# Patient Record
Sex: Male | Born: 1985 | Race: White | Hispanic: No | Marital: Married | Smoking: Current every day smoker
Health system: Southern US, Community
[De-identification: ages and names within clinical notes are randomized; demographics above are authoritative.]

## PROBLEM LIST (undated history)

## (undated) DIAGNOSIS — F419 Anxiety disorder, unspecified: Secondary | ICD-10-CM

## (undated) DIAGNOSIS — M419 Scoliosis, unspecified: Secondary | ICD-10-CM

## (undated) HISTORY — DX: Anxiety disorder, unspecified: F41.9

## (undated) HISTORY — DX: Scoliosis, unspecified: M41.9

---
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
## 2005-04-25 ENCOUNTER — Emergency Department (HOSPITAL_COMMUNITY): Admission: EM | Admit: 2005-04-25 | Discharge: 2005-04-25 | Payer: Self-pay | Admitting: Emergency Medicine

## 2005-09-04 ENCOUNTER — Emergency Department (HOSPITAL_COMMUNITY): Admission: EM | Admit: 2005-09-04 | Discharge: 2005-09-05 | Payer: Self-pay | Admitting: Emergency Medicine

## 2005-10-04 ENCOUNTER — Emergency Department (HOSPITAL_COMMUNITY): Admission: EM | Admit: 2005-10-04 | Discharge: 2005-10-04 | Payer: Self-pay | Admitting: Emergency Medicine

## 2007-09-21 ENCOUNTER — Emergency Department (HOSPITAL_COMMUNITY): Admission: EM | Admit: 2007-09-21 | Discharge: 2007-09-21 | Payer: Self-pay | Admitting: Emergency Medicine

## 2007-11-23 ENCOUNTER — Emergency Department (HOSPITAL_COMMUNITY): Admission: EM | Admit: 2007-11-23 | Discharge: 2007-11-23 | Payer: Self-pay | Admitting: Family Medicine

## 2008-08-28 ENCOUNTER — Emergency Department (HOSPITAL_COMMUNITY): Admission: EM | Admit: 2008-08-28 | Discharge: 2008-08-28 | Payer: Self-pay | Admitting: Emergency Medicine

## 2008-10-23 ENCOUNTER — Emergency Department (HOSPITAL_COMMUNITY): Admission: EM | Admit: 2008-10-23 | Discharge: 2008-10-23 | Payer: Self-pay | Admitting: Emergency Medicine

## 2009-07-22 ENCOUNTER — Emergency Department (HOSPITAL_COMMUNITY): Admission: EM | Admit: 2009-07-22 | Discharge: 2009-07-22 | Payer: Self-pay | Admitting: Emergency Medicine

## 2009-10-21 ENCOUNTER — Encounter: Admission: RE | Admit: 2009-10-21 | Discharge: 2009-10-21 | Payer: Self-pay | Admitting: Internal Medicine

## 2010-07-26 ENCOUNTER — Emergency Department (HOSPITAL_COMMUNITY)
Admission: EM | Admit: 2010-07-26 | Discharge: 2010-07-26 | Payer: Self-pay | Source: Home / Self Care | Admitting: Emergency Medicine

## 2010-10-07 LAB — URINE CULTURE
Colony Count: NO GROWTH
Culture: NO GROWTH

## 2010-10-07 LAB — GC/CHLAMYDIA PROBE AMP, GENITAL
Chlamydia, DNA Probe: NEGATIVE
GC Probe Amp, Genital: NEGATIVE

## 2010-10-07 LAB — URINALYSIS, ROUTINE W REFLEX MICROSCOPIC
Bilirubin Urine: NEGATIVE
Glucose, UA: NEGATIVE mg/dL
Hgb urine dipstick: NEGATIVE
Ketones, ur: NEGATIVE mg/dL
Nitrite: NEGATIVE
Protein, ur: NEGATIVE mg/dL
Specific Gravity, Urine: 1.016 (ref 1.005–1.030)
Urobilinogen, UA: 0.2 mg/dL (ref 0.0–1.0)
pH: 6.5 (ref 5.0–8.0)

## 2015-05-16 ENCOUNTER — Encounter: Payer: Self-pay | Admitting: Family Medicine

## 2015-05-16 ENCOUNTER — Ambulatory Visit: Payer: Self-pay | Admitting: Pediatrics

## 2015-05-16 ENCOUNTER — Ambulatory Visit (INDEPENDENT_AMBULATORY_CARE_PROVIDER_SITE_OTHER): Payer: Medicaid Other | Admitting: Family Medicine

## 2015-05-16 VITALS — BP 130/78 | HR 76 | Temp 97.7°F | Ht 72.0 in | Wt 181.8 lb

## 2015-05-16 DIAGNOSIS — G894 Chronic pain syndrome: Secondary | ICD-10-CM
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
## 2016-10-07 ENCOUNTER — Emergency Department (HOSPITAL_COMMUNITY): Payer: No Typology Code available for payment source

## 2016-10-07 ENCOUNTER — Encounter (HOSPITAL_COMMUNITY): Payer: Self-pay

## 2016-10-07 ENCOUNTER — Emergency Department (HOSPITAL_COMMUNITY)
Admission: EM | Admit: 2016-10-07 | Discharge: 2016-10-08 | Disposition: A | Discharge status: Home / Self Care | Payer: No Typology Code available for payment source | Attending: Emergency Medicine | Admitting: Emergency Medicine

## 2016-10-07 DIAGNOSIS — S060X1A Concussion with loss of consciousness of 30 minutes or less, initial encounter: Secondary | ICD-10-CM | POA: Diagnosis not present

## 2016-10-07 DIAGNOSIS — R918 Other nonspecific abnormal finding of lung field: Secondary | ICD-10-CM | POA: Diagnosis not present

## 2016-10-07 DIAGNOSIS — M545 Low back pain, unspecified: Secondary | ICD-10-CM

## 2016-10-07 DIAGNOSIS — Y939 Activity, unspecified: Secondary | ICD-10-CM | POA: Insufficient documentation

## 2016-10-07 DIAGNOSIS — Y999 Unspecified external cause status: Secondary | ICD-10-CM | POA: Diagnosis not present

## 2016-10-07 DIAGNOSIS — F17200 Nicotine dependence, unspecified, uncomplicated: Secondary | ICD-10-CM | POA: Diagnosis not present

## 2016-10-07 DIAGNOSIS — R938 Abnormal findings on diagnostic imaging of other specified body structures: Secondary | ICD-10-CM | POA: Diagnosis not present

## 2016-10-07 DIAGNOSIS — S0990XA Unspecified injury of head, initial encounter: Secondary | ICD-10-CM

## 2016-10-07 DIAGNOSIS — Y92410 Unspecified street and highway as the place of occurrence of the external cause: Secondary | ICD-10-CM | POA: Insufficient documentation

## 2016-10-07 LAB — I-STAT CHEM 8, ED
BUN: 15 mg/dL (ref 6–20)
CREATININE: 0.9 mg/dL (ref 0.61–1.24)
Calcium, Ion: 1.14 mmol/L — ABNORMAL LOW (ref 1.15–1.40)
Chloride: 106 mmol/L (ref 101–111)
Glucose, Bld: 142 mg/dL — ABNORMAL HIGH (ref 65–99)
HEMATOCRIT: 43 % (ref 39.0–52.0)
Hemoglobin: 14.6 g/dL (ref 13.0–17.0)
Potassium: 3.4 mmol/L — ABNORMAL LOW (ref 3.5–5.1)
SODIUM: 141 mmol/L (ref 135–145)
TCO2: 26 mmol/L (ref 0–100)

## 2016-10-07 MED ORDER — FENTANYL CITRATE (PF) 100 MCG/2ML IJ SOLN
50.0000 ug | Freq: Once | INTRAMUSCULAR | Status: AC
  Administered 2016-10-07: 50 ug via INTRAVENOUS
  Filled 2016-10-07: qty 2

## 2016-10-07 MED ORDER — SODIUM CHLORIDE 0.9 % IV SOLN: INTRAVENOUS | Status: DC

## 2016-10-07 MED ORDER — ONDANSETRON HCL 4 MG/2ML IJ SOLN
4.0000 mg | Freq: Once | INTRAMUSCULAR | Status: AC
  Administered 2016-10-07: 4 mg via INTRAVENOUS
  Filled 2016-10-07: qty 2

## 2016-10-07 MED ORDER — IOPAMIDOL (ISOVUE-300) INJECTION 61%
100.0000 mL | Freq: Once | INTRAVENOUS | Status: AC | PRN
  Administered 2016-10-07: 100 mL via INTRAVENOUS

## 2016-10-07 MED ORDER — SODIUM CHLORIDE 0.9 % IV BOLUS (SEPSIS)
500.0000 mL | Freq: Once | INTRAVENOUS | Status: AC
  Administered 2016-10-08: 1000 mL via INTRAVENOUS

## 2016-10-07 NOTE — ED Notes (Signed)
Received report on pt, pt cooperative, c/o headache, right flank pain, denies any LOC, update given,

## 2016-10-07 NOTE — ED Notes (Signed)
MD Zackowski notified pt is requesting MD at this time.

## 2016-10-07 NOTE — ED Notes (Signed)
Pt removed c-collar applied by EMS while out in waiting room, c/o it hurting his adams apple. New c-collar applied, pt instructed to leave collar on and not adjust collar.
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
## 2016-10-07 NOTE — ED Notes (Signed)
Patient transported to X-ray 
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
## 2016-10-07 NOTE — ED Notes (Signed)
Pt and family repeatedly asking for pain medication, stating "some nurse told me she was going to give me pain medicine and they were going to take x-rays". Pt made aware there are no orders at this time and MD has signed up for pt. Aware of delay.

## 2016-10-07 NOTE — ED Notes (Signed)
Pt reports generalized back pain, hx of scoliosis, bilateral shoulder pain, and HA. Denies LOC.

## 2016-10-08 MED ORDER — NAPROXEN 500 MG PO TABS: 500.0000 mg | ORAL_TABLET | Freq: Two times a day (BID) | ORAL | 0 refills | Status: DC

## 2016-10-08 MED ORDER — HYDROMORPHONE HCL 1 MG/ML IJ SOLN
1.0000 mg | Freq: Once | INTRAMUSCULAR | Status: AC
  Administered 2016-10-08: 1 mg via INTRAVENOUS
  Filled 2016-10-08: qty 1

## 2016-10-08 MED ORDER — HYDROMORPHONE HCL 4 MG PO TABS: 4.0000 mg | ORAL_TABLET | Freq: Four times a day (QID) | ORAL | 0 refills | Status: DC | PRN

## 2016-10-08 MED ORDER — FENTANYL CITRATE (PF) 100 MCG/2ML IJ SOLN
50.0000 ug | Freq: Once | INTRAMUSCULAR | Status: AC
  Administered 2016-10-08: 50 ug via INTRAVENOUS
  Filled 2016-10-08: qty 2
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
## 2016-10-08 NOTE — ED Notes (Signed)
Pt and family updated on plan of care,  

## 2016-10-08 NOTE — ED Notes (Signed)
Pt returned from xray

## 2016-10-08 NOTE — Discharge Instructions (Signed)
Take the Naprosyn on a regular basis. Supplement with the hydromorphone as needed for additional pain relief. Expect to be very stiff and sore for the next several days. Workup here without any acute findings. Work note provided.
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
## 2020-02-26 ENCOUNTER — Other Ambulatory Visit: Payer: Self-pay

## 2020-02-26 ENCOUNTER — Encounter (HOSPITAL_COMMUNITY)
Admission: EM | Discharge status: Against Medical Advice | Payer: Self-pay | Source: Home / Self Care | Attending: Internal Medicine

## 2020-02-26 ENCOUNTER — Inpatient Hospital Stay (HOSPITAL_COMMUNITY): Payer: Self-pay | Admitting: Certified Registered Nurse Anesthetist

## 2020-02-26 ENCOUNTER — Emergency Department (HOSPITAL_COMMUNITY): Payer: Self-pay

## 2020-02-26 ENCOUNTER — Encounter (HOSPITAL_COMMUNITY): Payer: Self-pay | Admitting: Emergency Medicine

## 2020-02-26 ENCOUNTER — Inpatient Hospital Stay (HOSPITAL_COMMUNITY)
Admission: EM | Admit: 2020-02-26 | Discharge: 2020-02-27 | DRG: 603 | Discharge status: Against Medical Advice | Payer: Self-pay | Attending: Internal Medicine | Admitting: Internal Medicine

## 2020-02-26 DIAGNOSIS — E876 Hypokalemia: Secondary | ICD-10-CM | POA: Diagnosis present

## 2020-02-26 DIAGNOSIS — Z801 Family history of malignant neoplasm of trachea, bronchus and lung: Secondary | ICD-10-CM

## 2020-02-26 DIAGNOSIS — B957 Other staphylococcus as the cause of diseases classified elsewhere: Secondary | ICD-10-CM | POA: Diagnosis present

## 2020-02-26 DIAGNOSIS — F419 Anxiety disorder, unspecified: Secondary | ICD-10-CM | POA: Diagnosis present

## 2020-02-26 DIAGNOSIS — F17210 Nicotine dependence, cigarettes, uncomplicated: Secondary | ICD-10-CM | POA: Diagnosis present

## 2020-02-26 DIAGNOSIS — Z880 Allergy status to penicillin: Secondary | ICD-10-CM

## 2020-02-26 DIAGNOSIS — Z720 Tobacco use: Secondary | ICD-10-CM

## 2020-02-26 DIAGNOSIS — Z20822 Contact with and (suspected) exposure to covid-19: Secondary | ICD-10-CM | POA: Diagnosis present

## 2020-02-26 DIAGNOSIS — Z79899 Other long term (current) drug therapy: Secondary | ICD-10-CM

## 2020-02-26 DIAGNOSIS — F1110 Opioid abuse, uncomplicated: Secondary | ICD-10-CM

## 2020-02-26 DIAGNOSIS — L0211 Cutaneous abscess of neck: Principal | ICD-10-CM | POA: Diagnosis present

## 2020-02-26 DIAGNOSIS — Z8249 Family history of ischemic heart disease and other diseases of the circulatory system: Secondary | ICD-10-CM

## 2020-02-26 DIAGNOSIS — F1990 Other psychoactive substance use, unspecified, uncomplicated: Secondary | ICD-10-CM

## 2020-02-26 DIAGNOSIS — Z885 Allergy status to narcotic agent status: Secondary | ICD-10-CM

## 2020-02-26 HISTORY — PX: INCISION AND DRAINAGE ABSCESS: SHX5864

## 2020-02-26 LAB — CBC WITH DIFFERENTIAL/PLATELET
Abs Immature Granulocytes: 0.09 10*3/uL — ABNORMAL HIGH (ref 0.00–0.07)
Basophils Absolute: 0.1 10*3/uL (ref 0.0–0.1)
Basophils Relative: 0 %
Eosinophils Absolute: 0.1 10*3/uL (ref 0.0–0.5)
Eosinophils Relative: 1 %
HCT: 40 % (ref 39.0–52.0)
Hemoglobin: 13.3 g/dL (ref 13.0–17.0)
Immature Granulocytes: 1 %
Lymphocytes Relative: 15 %
Lymphs Abs: 2.7 10*3/uL (ref 0.7–4.0)
MCH: 30.5 pg (ref 26.0–34.0)
MCHC: 33.3 g/dL (ref 30.0–36.0)
MCV: 91.7 fL (ref 80.0–100.0)
Monocytes Absolute: 1 10*3/uL (ref 0.1–1.0)
Monocytes Relative: 6 %
Neutro Abs: 14.2 10*3/uL — ABNORMAL HIGH (ref 1.7–7.7)
Neutrophils Relative %: 77 %
Platelets: 424 10*3/uL — ABNORMAL HIGH (ref 150–400)
RBC: 4.36 MIL/uL (ref 4.22–5.81)
RDW: 13 % (ref 11.5–15.5)
WBC: 18.2 10*3/uL — ABNORMAL HIGH (ref 4.0–10.5)
nRBC: 0 % (ref 0.0–0.2)

## 2020-02-26 LAB — BASIC METABOLIC PANEL
Anion gap: 9 (ref 5–15)
BUN: 12 mg/dL (ref 6–20)
CO2: 27 mmol/L (ref 22–32)
Calcium: 9 mg/dL (ref 8.9–10.3)
Chloride: 101 mmol/L (ref 98–111)
Creatinine, Ser: 0.79 mg/dL (ref 0.61–1.24)
GFR calc Af Amer: >60 mL/min (ref >60)
GFR calc non Af Amer: >60 mL/min (ref >60)
Glucose, Bld: 103 mg/dL — ABNORMAL HIGH (ref 70–99)
Potassium: 3.3 mmol/L — ABNORMAL LOW (ref 3.5–5.1)
Sodium: 137 mmol/L (ref 135–145)

## 2020-02-26 LAB — SARS CORONAVIRUS 2 BY RT PCR (HOSPITAL ORDER, PERFORMED IN ~~LOC~~ HOSPITAL LAB): SARS Coronavirus 2: NEGATIVE

## 2020-02-26 LAB — SURGICAL PCR SCREEN
MRSA, PCR: NEGATIVE
Staphylococcus aureus: NEGATIVE

## 2020-02-26 SURGERY — INCISION AND DRAINAGE, ABSCESS
Anesthesia: General | Site: Neck | Laterality: Left

## 2020-02-26 MED ORDER — FENTANYL CITRATE (PF) 250 MCG/5ML IJ SOLN
INTRAMUSCULAR | Status: AC
Start: 2020-02-26 — End: ?
  Filled 2020-02-26: qty 5

## 2020-02-26 MED ORDER — PROPOFOL 10 MG/ML IV BOLUS
INTRAVENOUS | Status: AC
  Filled 2020-02-26: qty 20

## 2020-02-26 MED ORDER — FENTANYL CITRATE (PF) 100 MCG/2ML IJ SOLN
INTRAMUSCULAR | Status: AC
  Filled 2020-02-26: qty 2

## 2020-02-26 MED ORDER — LIDOCAINE-EPINEPHRINE 1 %-1:100000 IJ SOLN
INTRAMUSCULAR | Status: AC
  Filled 2020-02-26: qty 1

## 2020-02-26 MED ORDER — METRONIDAZOLE IN NACL 5-0.79 MG/ML-% IV SOLN
500.0000 mg | Freq: Once | INTRAVENOUS | Status: AC
  Administered 2020-02-26: 500 mg via INTRAVENOUS
  Filled 2020-02-26: qty 100

## 2020-02-26 MED ORDER — ONDANSETRON HCL 4 MG/2ML IJ SOLN
4.0000 mg | Freq: Once | INTRAMUSCULAR | Status: DC
  Filled 2020-02-26: qty 2

## 2020-02-26 MED ORDER — OXYCODONE HCL 5 MG PO TABS
ORAL_TABLET | ORAL | Status: AC
  Filled 2020-02-26: qty 1

## 2020-02-26 MED ORDER — PROPOFOL 10 MG/ML IV BOLUS
INTRAVENOUS | Status: DC | PRN
  Administered 2020-02-26: 200 mg via INTRAVENOUS

## 2020-02-26 MED ORDER — ENOXAPARIN SODIUM 40 MG/0.4ML ~~LOC~~ SOLN
40.0000 mg | SUBCUTANEOUS | Status: DC
  Filled 2020-02-26: qty 0.4

## 2020-02-26 MED ORDER — ONDANSETRON HCL 4 MG/2ML IJ SOLN: 4.0000 mg | Freq: Four times a day (QID) | INTRAMUSCULAR | Status: DC | PRN

## 2020-02-26 MED ORDER — HYDROMORPHONE HCL 1 MG/ML IJ SOLN
1.0000 mg | INTRAMUSCULAR | Status: DC | PRN
  Administered 2020-02-26 – 2020-02-27 (×5): 1 mg via INTRAVENOUS
  Filled 2020-02-26 (×5): qty 1

## 2020-02-26 MED ORDER — VANCOMYCIN HCL 1500 MG/300ML IV SOLN
1500.0000 mg | Freq: Once | INTRAVENOUS | Status: DC
  Filled 2020-02-26 (×2): qty 300

## 2020-02-26 MED ORDER — SUCCINYLCHOLINE CHLORIDE 200 MG/10ML IV SOSY
PREFILLED_SYRINGE | INTRAVENOUS | Status: DC | PRN
  Administered 2020-02-26: 100 mg via INTRAVENOUS

## 2020-02-26 MED ORDER — FENTANYL CITRATE (PF) 250 MCG/5ML IJ SOLN
INTRAMUSCULAR | Status: AC
  Filled 2020-02-26: qty 5

## 2020-02-26 MED ORDER — MORPHINE SULFATE (PF) 4 MG/ML IV SOLN
4.0000 mg | Freq: Once | INTRAVENOUS | Status: DC
  Filled 2020-02-26: qty 1

## 2020-02-26 MED ORDER — CHLORHEXIDINE GLUCONATE 0.12 % MT SOLN
OROMUCOSAL | Status: AC
  Administered 2020-02-26: 15 mL via OROMUCOSAL
  Filled 2020-02-26: qty 15

## 2020-02-26 MED ORDER — ACETAMINOPHEN 325 MG PO TABS: 650.0000 mg | ORAL_TABLET | Freq: Four times a day (QID) | ORAL | Status: DC | PRN

## 2020-02-26 MED ORDER — VANCOMYCIN HCL 1500 MG/300ML IV SOLN
1500.0000 mg | Freq: Two times a day (BID) | INTRAVENOUS | Status: DC
  Administered 2020-02-26 – 2020-02-27 (×2): 1500 mg via INTRAVENOUS
  Filled 2020-02-26 (×3): qty 300

## 2020-02-26 MED ORDER — MIDAZOLAM HCL 5 MG/5ML IJ SOLN
INTRAMUSCULAR | Status: DC | PRN
  Administered 2020-02-26: 2 mg via INTRAVENOUS

## 2020-02-26 MED ORDER — ACETAMINOPHEN 650 MG RE SUPP: 650.0000 mg | Freq: Four times a day (QID) | RECTAL | Status: DC | PRN

## 2020-02-26 MED ORDER — ONDANSETRON HCL 4 MG/2ML IJ SOLN
INTRAMUSCULAR | Status: DC | PRN
  Administered 2020-02-26: 4 mg via INTRAVENOUS

## 2020-02-26 MED ORDER — FENTANYL CITRATE (PF) 100 MCG/2ML IJ SOLN
25.0000 ug | INTRAMUSCULAR | Status: DC | PRN
  Administered 2020-02-26 (×3): 50 ug via INTRAVENOUS

## 2020-02-26 MED ORDER — HYDROMORPHONE HCL 1 MG/ML IJ SOLN
1.0000 mg | Freq: Once | INTRAMUSCULAR | Status: AC
  Administered 2020-02-26: 1 mg via INTRAVENOUS
  Filled 2020-02-26: qty 1

## 2020-02-26 MED ORDER — IOHEXOL 300 MG/ML  SOLN
75.0000 mL | Freq: Once | INTRAMUSCULAR | Status: AC | PRN
  Administered 2020-02-26: 75 mL via INTRAVENOUS

## 2020-02-26 MED ORDER — ONDANSETRON HCL 4 MG PO TABS: 4.0000 mg | ORAL_TABLET | Freq: Four times a day (QID) | ORAL | Status: DC | PRN

## 2020-02-26 MED ORDER — DEXMEDETOMIDINE HCL 200 MCG/2ML IV SOLN
INTRAVENOUS | Status: DC | PRN
  Administered 2020-02-26: 20 ug via INTRAVENOUS

## 2020-02-26 MED ORDER — OXYCODONE HCL 5 MG PO TABS
5.0000 mg | ORAL_TABLET | Freq: Once | ORAL | Status: AC | PRN
  Administered 2020-02-26: 5 mg via ORAL

## 2020-02-26 MED ORDER — MIDAZOLAM HCL 2 MG/2ML IJ SOLN
INTRAMUSCULAR | Status: AC
  Filled 2020-02-26: qty 2

## 2020-02-26 MED ORDER — SODIUM CHLORIDE 0.9 % IR SOLN
Status: DC | PRN
  Administered 2020-02-26: 1000 mL

## 2020-02-26 MED ORDER — LIDOCAINE 2% (20 MG/ML) 5 ML SYRINGE
INTRAMUSCULAR | Status: DC | PRN
  Administered 2020-02-26: 50 mg via INTRAVENOUS

## 2020-02-26 MED ORDER — SODIUM CHLORIDE 0.9 % IV SOLN
2.0000 g | Freq: Three times a day (TID) | INTRAVENOUS | Status: DC
  Administered 2020-02-26 – 2020-02-27 (×2): 2 g via INTRAVENOUS
  Filled 2020-02-26 (×2): qty 2

## 2020-02-26 MED ORDER — HYDROMORPHONE HCL 2 MG/ML IJ SOLN
2.0000 mg | Freq: Once | INTRAMUSCULAR | Status: AC
  Administered 2020-02-26: 2 mg via INTRAMUSCULAR
  Filled 2020-02-26: qty 1

## 2020-02-26 MED ORDER — POTASSIUM CHLORIDE IN NACL 20-0.9 MEQ/L-% IV SOLN
INTRAVENOUS | Status: DC
  Filled 2020-02-26 (×2): qty 1000

## 2020-02-26 MED ORDER — ONDANSETRON HCL 4 MG/2ML IJ SOLN
4.0000 mg | Freq: Four times a day (QID) | INTRAMUSCULAR | Status: DC | PRN
  Administered 2020-02-27: 4 mg via INTRAVENOUS
  Filled 2020-02-26: qty 2

## 2020-02-26 MED ORDER — LACTATED RINGERS IV SOLN: INTRAVENOUS | Status: DC

## 2020-02-26 MED ORDER — FENTANYL CITRATE (PF) 250 MCG/5ML IJ SOLN
INTRAMUSCULAR | Status: DC | PRN
Start: 2020-02-26 — End: 2020-02-26
  Administered 2020-02-26 (×2): 100 ug via INTRAVENOUS
  Administered 2020-02-26 (×2): 150 ug via INTRAVENOUS

## 2020-02-26 MED ORDER — LIDOCAINE-EPINEPHRINE 1 %-1:100000 IJ SOLN
INTRAMUSCULAR | Status: DC | PRN
  Administered 2020-02-26: 1 mL

## 2020-02-26 MED ORDER — VANCOMYCIN HCL IN DEXTROSE 1-5 GM/200ML-% IV SOLN
1000.0000 mg | Freq: Once | INTRAVENOUS | Status: AC
  Administered 2020-02-26: 1000 mg via INTRAVENOUS
  Filled 2020-02-26: qty 200

## 2020-02-26 MED ORDER — OXYCODONE HCL 5 MG/5ML PO SOLN: 5.0000 mg | Freq: Once | ORAL | Status: AC | PRN

## 2020-02-26 MED ORDER — NICOTINE 21 MG/24HR TD PT24
21.0000 mg | MEDICATED_PATCH | Freq: Every day | TRANSDERMAL | Status: DC
  Administered 2020-02-26 – 2020-02-27 (×2): 21 mg via TRANSDERMAL
  Filled 2020-02-26 (×2): qty 1

## 2020-02-26 MED ORDER — CHLORHEXIDINE GLUCONATE 0.12 % MT SOLN: 15.0000 mL | Freq: Once | OROMUCOSAL | Status: AC

## 2020-02-26 MED ORDER — SODIUM CHLORIDE 0.9 % IV SOLN
2.0000 g | Freq: Once | INTRAVENOUS | Status: AC
  Administered 2020-02-26: 2 g via INTRAVENOUS
  Filled 2020-02-26: qty 2

## 2020-02-26 MED ORDER — LACTATED RINGERS IV SOLN: INTRAVENOUS | Status: DC | PRN

## 2020-02-26 SURGICAL SUPPLY — 47 items
BAND INSRT 18 STRL LF DISP RB (MISCELLANEOUS)
BAND RUBBER #18 3X1/16 STRL (MISCELLANEOUS) IMPLANT
BLADE SURG 15 STRL LF DISP TIS (BLADE) IMPLANT
BLADE SURG 15 STRL SS (BLADE)
BNDG CONFORM 2 STRL LF (GAUZE/BANDAGES/DRESSINGS) ×3 IMPLANT
BNDG GAUZE ELAST 4 BULKY (GAUZE/BANDAGES/DRESSINGS) ×2 IMPLANT
CATH ROBINSON RED A/P 12FR (CATHETERS) ×5 IMPLANT
COVER SURGICAL LIGHT HANDLE (MISCELLANEOUS) ×6 IMPLANT
COVER WAND RF STERILE (DRAPES) ×3 IMPLANT
DECANTER SPIKE VIAL GLASS SM (MISCELLANEOUS) ×2 IMPLANT
DRAIN PENROSE 1/4X12 LTX STRL (WOUND CARE) ×1 IMPLANT
DRAPE HALF SHEET 40X57 (DRAPES) IMPLANT
DRAPE ORTHO SPLIT 77X108 STRL (DRAPES) ×3
DRAPE SURG ORHT 6 SPLT 77X108 (DRAPES) ×1 IMPLANT
DRSG PAD ABDOMINAL 8X10 ST (GAUZE/BANDAGES/DRESSINGS) IMPLANT
ELECT COATED BLADE 2.86 ST (ELECTRODE) ×5 IMPLANT
ELECT REM PT RETURN 9FT ADLT (ELECTROSURGICAL) ×3
ELECTRODE REM PT RTRN 9FT ADLT (ELECTROSURGICAL) ×1 IMPLANT
GAUZE 4X4 16PLY RFD (DISPOSABLE) ×3 IMPLANT
GAUZE SPONGE 4X4 12PLY STRL (GAUZE/BANDAGES/DRESSINGS) IMPLANT
GLOVE BIO SURGEON STRL SZ7.5 (GLOVE) ×3 IMPLANT
GLOVE BIOGEL PI IND STRL 6.5 (GLOVE) IMPLANT
GLOVE BIOGEL PI INDICATOR 6.5 (GLOVE) ×2
GLOVE INDICATOR 7.5 STRL GRN (GLOVE) ×4 IMPLANT
GOWN STRL REUS W/ TWL LRG LVL3 (GOWN DISPOSABLE) ×1 IMPLANT
GOWN STRL REUS W/ TWL XL LVL3 (GOWN DISPOSABLE) IMPLANT
GOWN STRL REUS W/TWL LRG LVL3 (GOWN DISPOSABLE) ×3
GOWN STRL REUS W/TWL XL LVL3 (GOWN DISPOSABLE) ×3
KIT BASIN OR (CUSTOM PROCEDURE TRAY) ×3 IMPLANT
KIT TURNOVER KIT B (KITS) ×3 IMPLANT
MARKER SKIN DUAL TIP RULER LAB (MISCELLANEOUS) ×3 IMPLANT
NDL HYPO 25GX1X1/2 BEV (NEEDLE) ×1 IMPLANT
NEEDLE HYPO 25GX1X1/2 BEV (NEEDLE) ×6 IMPLANT
NS IRRIG 1000ML POUR BTL (IV SOLUTION) ×3 IMPLANT
PACK SURGICAL SETUP 50X90 (CUSTOM PROCEDURE TRAY) ×3 IMPLANT
PAD ARMBOARD 7.5X6 YLW CONV (MISCELLANEOUS) ×6 IMPLANT
PENCIL SMOKE EVACUATOR (MISCELLANEOUS) ×3 IMPLANT
POSITIONER HEAD DONUT 9IN (MISCELLANEOUS) IMPLANT
SUT ETHILON 2 0 FS 18 (SUTURE) ×3 IMPLANT
SUT SILK 2 0 SH CR/8 (SUTURE) ×2 IMPLANT
SWAB COLLECTION DEVICE MRSA (MISCELLANEOUS) ×3 IMPLANT
SWAB CULTURE ESWAB REG 1ML (MISCELLANEOUS) ×3 IMPLANT
SYR BULB IRRIG 60ML STRL (SYRINGE) ×3 IMPLANT
SYR CONTROL 10ML LL (SYRINGE) ×2 IMPLANT
TUBE CONNECTING 12'X1/4 (SUCTIONS) ×1
TUBE CONNECTING 12X1/4 (SUCTIONS) ×2 IMPLANT
YANKAUER SUCT BULB TIP NO VENT (SUCTIONS) ×3 IMPLANT

## 2020-02-26 NOTE — Anesthesia Procedure Notes (Signed)
Procedure Name: Intubation Date/Time: 02/26/2020 6:24 PM Performed by: Shirlyn Goltz, CRNA Pre-anesthesia Checklist: Patient identified, Emergency Drugs available and Suction available Patient Re-evaluated:Patient Re-evaluated prior to induction Oxygen Delivery Method: Circle system utilized Preoxygenation: Pre-oxygenation with 100% oxygen Induction Type: IV induction and Rapid sequence Laryngoscope Size: Mac and 4 Grade View: Grade II Tube type: Oral Tube size: 7.5 mm Number of attempts: 1 Airway Equipment and Method: Stylet Placement Confirmation: ETT inserted through vocal cords under direct vision,  positive ETCO2 and breath sounds checked- equal and bilateral Secured at: 22 cm Tube secured with: Tape Dental Injury: Teeth and Oropharynx as per pre-operative assessment

## 2020-02-26 NOTE — Op Note (Addendum)
Preop diagnosis: Left neck abscess Postop diagnosis: same Procedure: Incision and drainage left neck abscess Surgeon: Jenne Pane Assist: None Anesth: General and local with 1% lidocaine with 1:100,000 epinephrine Compl: None Findings: Copious yellow pus drained, cultures obtained Description:  After discussing risks, benefits, and alternatives, the patient was brought to the operative suite and placed on the operative table in the supine position.  Anesthesia was induced and the patient was intubated by the anesthesia team without difficulty.  The left neck incision was marked with a marking pen and injected with local anesthetic.  The neck was prepped and draped in sterile fashion.  The incision was made with a 15 blade scalpel and extended through the subcutaneous and platysma layers with Bovie electrocautery.  Blunt dissection with a hemostat was then performed into the fluid pocket.  Copious yellow pus drained.  Culture swabs were used within the pocket.  Loculations were broken within the abscess space using a blunt clamp.  The pocket was then copiously irrigated with saline using a red rubber catheter.  A 1/4 inch Penrose drain was placed in the depth of the wound and secured at the skin using 2-0 Nylon.  Drapes were removed and the patient was cleaned off.  A Kerlex fluff dressing was placed around the neck.  He was then returned to anesthesia for wake-up and was extubated and moved to the recovery room in stable condition.  Of note, a crusted area on the right chin was denuded and yellow pus was expressed from this site until only blood was encountered.  A dry dressing was applied.
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
## 2020-02-26 NOTE — ED Notes (Signed)
Pt requesting to leave and sign out AMA. Pt informed if signs out AMA and leave to go another facility will have to start the whole process over, midline IV will have to be removed. Informed pt and mother best to wait and see if we can get him transferred to another facility and let the EDP speak with ENT and general surgery first. Pt and mother agree. EDP at bedside to discuss plan of care.

## 2020-02-26 NOTE — Anesthesia Preprocedure Evaluation (Signed)
Anesthesia Evaluation  Patient identified by MRN, date of birth, ID band Patient awake    Reviewed: Allergy & Precautions, H&P , NPO status , Patient's Chart, lab work & pertinent test results  Airway Mallampati: II   Neck ROM: full    Dental   Pulmonary Current Smoker,    breath sounds clear to auscultation       Cardiovascular negative cardio ROS   Rhythm:regular Rate:Normal     Neuro/Psych PSYCHIATRIC DISORDERS Anxiety    GI/Hepatic   Endo/Other    Renal/GU      Musculoskeletal   Abdominal   Peds  Hematology   Anesthesia Other Findings   Reproductive/Obstetrics                             Anesthesia Physical Anesthesia Plan  ASA: II  Anesthesia Plan: General   Post-op Pain Management:    Induction: Intravenous  PONV Risk Score and Plan: 1 and Ondansetron, Dexamethasone, Midazolam and Treatment may vary due to age or medical condition  Airway Management Planned: Oral ETT  Additional Equipment:   Intra-op Plan:   Post-operative Plan: Extubation in OR  Informed Consent: I have reviewed the patients History and Physical, chart, labs and discussed the procedure including the risks, benefits and alternatives for the proposed anesthesia with the patient or authorized representative who has indicated his/her understanding and acceptance.       Plan Discussed with: CRNA, Anesthesiologist and Surgeon  Anesthesia Plan Comments:         Anesthesia Quick Evaluation
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
## 2020-02-26 NOTE — ED Notes (Signed)
Attempted to call IV Team without success.  Called [facility].  They said. "May be between 2-4pm.  Nurse informed.

## 2020-02-26 NOTE — Progress Notes (Signed)
Pharmacy Antibiotic Note  Noah Turner is a 34 y.o. male admitted on 02/26/2020 with cellulitis/wound infection.  Pharmacy has been consulted for Cefepime and Vancomycin dosing.  Plan: Vancomycin 1500 IV every 12 hours.  Goal trough 10-15 mcg/mL. Cefepime 2gm IV q8h F/u Cxs and clinical progress Monitor V/S, labs and levels as indicated  Height: 6' (182.9 cm) Weight: 86.2 kg (190 lb) IBW/kg (Calculated) : 77.6  Temp (24hrs), Avg:99.7 F (37.6 C), Min:99.5 F (37.5 C), Max:99.8 F (37.7 C)  Recent Labs  Lab 02/26/20 0919  WBC 18.2*  CREATININE 0.79    Estimated Creatinine Clearance: 144.2 mL/min (by C-G formula based on SCr of 0.79 mg/dL).    Allergies  Allergen Reactions  . Hydrocodone   . Penicillins     Antimicrobials this admission: Vancomycin 8/31 >>  Cefepime 8/31 >>  Flagyl 8/31 x 1 dose  Dose adjustments this admission: prn  Microbiology results: No cxs  Thank you for allowing pharmacy to be a part of this patient's care.  Elder Cyphers, BS Loura Back, [place] Clinical Pharmacist Pager [phone] 02/26/2020 3:03 PM

## 2020-02-26 NOTE — Progress Notes (Signed)
NEW ADMISSION NOTE New Admission Note:   Arrival Method: via stretcher from [facility] ED Mental Orientation: Alert and oriented x4 Telemetry: n/a Assessment: Completed Skin: redness and swollen neck; blister on chin. IV: R upper arm midline Pain: 5/10 Tubes: n/a Safety Measures: Safety Fall Prevention Plan has been given, discussed and signed Admission: In progress [facility] Orientation: Patient has been orientated to the room, unit and staff.  Family: at bedside.  Dr. Jenne Pane notified of patient arrival. Orders have been reviewed and implemented. Will continue to monitor the patient. Call light has been placed within reach and bed alarm has been activated.   Margretta Sidle, RN3

## 2020-02-26 NOTE — ED Provider Notes (Signed)
[facility] EMERGENCY DEPARTMENT Provider Note   CSN: [id number] Arrival date & time: 02/26/20  0757     History Chief Complaint  Patient presents with  . neck mass  . Insect Bite    Noah Turner is a 34 y.o. male.  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  Noah Turner was recently admitted to [facility] for an abscess to the right anterior aspect of his neck.  This was apparently incised and drained by general surgery there.  Noah Turner is now having a recurrence of this, but this time to the left anterior aspect of his neck.  The swelling has worsened over the past several days.  Noah Turner describes difficulty swallowing, but denies difficulty breathing.  Noah Turner denies fevers or chills.  The history is provided by the patient.       Past Medical History:  Diagnosis Date  . Anxiety   . Scoliosis     There are no problems to display for this patient.   History reviewed. No pertinent surgical history.     Family History  Problem Relation Age of Onset  . CAD Father   . Lung cancer Paternal Grandfather     Social History   Tobacco Use  . Smoking status: Current Every Day Smoker    Packs/day: 0.50    Years: 15.00    Pack years: 7.50  . Smokeless tobacco: Never Used  Substance Use Topics  . Alcohol use: Yes    Comment: 1/2 beer today  . Drug use: No    Comment: history of heroine abuse    Home Medications Prior to Admission medications   Medication Sig Start Date End Date Taking? Authorizing Provider  AMITIZA 24 MCG capsule Take 1 capsule by mouth 2 (two) times daily. As needed for constipation 04/19/15   [provider]  gabapentin (NEURONTIN) 300 MG capsule Take 1 capsule at lunch, 2 capsule at supper and 3 caps nightly at bedtime max 6 caps per day 04/19/15   [provider]  HYDROmorphone (DILAUDID) 4 MG tablet Take 1 tablet (4 mg total) by mouth every 6 (six) hours as needed for severe pain. 10/08/16   Vanetta Mulders, MD    naproxen (NAPROSYN) 500 MG tablet Take 1 tablet (500 mg total) by mouth 2 (two) times daily. 10/08/16   Vanetta Mulders, MD  SUBOXONE 8-2 MG FILM Place 1 Film under the tongue every 8 (eight) hours. 04/19/15   [provider]    Allergies    Hydrocodone and Penicillins  Review of Systems   Review of Systems  All other systems reviewed and are negative.   Physical Exam Updated Vital Signs BP (!) 132/93   Pulse 93   Temp 99.5 F (37.5 C)   Resp 20   Ht 6' (1.829 m)   Wt 86.2 kg   SpO2 97%   BMI 25.77 kg/m   Physical Exam Vitals and nursing note reviewed.  Constitutional:      General: Noah Turner is not in acute distress.    Appearance: Noah Turner is well-developed. Noah Turner is not diaphoretic.  HENT:     Head: Normocephalic and atraumatic.  Neck:     Comments: There is a 4 cm x 6 cm area of induration, swelling, warmth, and erythema to the left anterior aspect of the neck.  There is also erythema and purulent drainage to the anterior aspect of the chin. Cardiovascular:     Rate and Rhythm: Normal rate and  regular rhythm.     Heart sounds: No murmur heard.  No friction rub.  Pulmonary:     Effort: Pulmonary effort is normal. No respiratory distress.     Breath sounds: Normal breath sounds. No wheezing or rales.  Abdominal:     General: Bowel sounds are normal. There is no distension.     Palpations: Abdomen is soft.     Tenderness: There is no abdominal tenderness.  Musculoskeletal:        General: Normal range of motion.     Cervical back: Normal range of motion and neck supple. Tenderness present.  Skin:    General: Skin is warm and dry.  Neurological:     Mental Status: Noah Turner is alert and oriented to person, place, and time.     Coordination: Coordination normal.     ED Results / Procedures / Treatments   Labs (all labs ordered are listed, but only abnormal results are displayed) Labs Reviewed  BASIC METABOLIC PANEL  CBC WITH DIFFERENTIAL/PLATELET     EKG None  Radiology No results found.  Procedures Procedures (including critical care time)  Medications Ordered in ED Medications  ondansetron (ZOFRAN) injection 4 mg (has no administration in time range)  morphine 4 MG/ML injection 4 mg (has no administration in time range)  vancomycin (VANCOCIN) IVPB 1000 mg/200 mL premix (has no administration in time range)    ED Course  I have reviewed the triage vital signs and the nursing notes.  Pertinent labs & imaging results that were available during my care of the patient were reviewed by me and considered in my medical decision making (see chart for details).    MDM Rules/Calculators/A&P  Patient is a 34 year old male with history of IV drug abuse and recent admission/surgical debridement at [facility] of a right neck abscess.  Patient presents here today with complaints of swelling and pain this time to the left side of his neck.  CT scan is consistent with an multilocular abscess of the soft tissues of the neck extending along the sternocleidomastoid muscle along with displacement of the subglottic trachea and posterior displacement of the left thyroid lobe and effacement of the left caudal internal jugular.  This finding was discussed with Dr. Jenne Pane from ENT.  Noah Turner has recommended admission to the hospitalist service and Noah Turner plans to incise and drain the area this evening.  Patient to be transferred to [facility] for this procedure.  Pain medication given and vancomycin also administered.  Also of note is that this patient is a very difficult stick.  Noah Turner has minimal veins secondary to his history of IV drug use and required a midline catheter.  CRITICAL CARE Performed by: Geoffery Lyons Total critical care time: 45 minutes Critical care time was exclusive of separately billable procedures and treating other patients. Critical care was necessary to treat or prevent imminent or life-threatening deterioration. Critical care was time  spent personally by me on the following activities: development of treatment plan with patient and/or surrogate as well as nursing, discussions with consultants, evaluation of patient's response to treatment, examination of patient, obtaining history from patient or surrogate, ordering and performing treatments and interventions, ordering and review of laboratory studies, ordering and review of radiographic studies, pulse oximetry and re-evaluation of patient's condition.   Final Clinical Impression(s) / ED Diagnoses Final diagnoses:  None    Rx / DC Orders ED Discharge Orders    None       Geoffery Lyons, MD 02/26/20 1444

## 2020-02-26 NOTE — Progress Notes (Signed)
Report attempted x 1
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
## 2020-02-26 NOTE — Brief Op Note (Signed)
02/26/2020  6:50 PM  PATIENT:  Noah Turner  34 y.o. male  PRE-OPERATIVE DIAGNOSIS:  Left neck abscess  POST-OPERATIVE DIAGNOSIS:  Left neck abscess  PROCEDURE:  Procedure(s): INCISION AND DRAINAGE LEFT NECK ABSCESS (Left)  SURGEON:  Surgeon(s) and Role:    Christia Reading, MD - Primary  PHYSICIAN ASSISTANT:   ASSISTANTS: none   ANESTHESIA:   general  EBL: Minimal  BLOOD ADMINISTERED:none  DRAINS: Penrose drain in the left neck   LOCAL MEDICATIONS USED:  LIDOCAINE   SPECIMEN:  No Specimen  DISPOSITION OF SPECIMEN:  N/A  COUNTS:  YES  TOURNIQUET:  * No tourniquets in log *  DICTATION: .Note written in EPIC  PLAN OF CARE: Return to hospital room  PATIENT DISPOSITION:  PACU - hemodynamically stable.   Delay start of Pharmacological VTE agent (>24hrs) due to surgical blood loss or risk of bleeding: no

## 2020-02-26 NOTE — Transfer of Care (Signed)
Immediate Anesthesia Transfer of Care Note  Patient: Noah Turner  Procedure(s) Performed: INCISION AND DRAINAGE LEFT NECK ABSCESS (Left Neck)  Patient Location: PACU  Anesthesia Type:General  Level of Consciousness: awake, alert , oriented and patient cooperative  Airway & Oxygen Therapy: Patient Spontanous Breathing and Patient connected to nasal cannula oxygen  Post-op Assessment: Report given to RN and Post -op Vital signs reviewed and stable  Post vital signs: Reviewed and stable  Last Vitals:  Vitals Value Taken Time  BP 127/82 02/26/20 1854  Temp 36.7 C 02/26/20 1854  Pulse 85 02/26/20 1902  Resp 13 02/26/20 1902  SpO2 98 % 02/26/20 1902  Vitals shown include unvalidated device data.  Last Pain:  Vitals:   02/26/20 1854  TempSrc:   PainSc: 6       Patients Stated Pain Goal: 4 (02/26/20 1854)  Complications: No complications documented.

## 2020-02-26 NOTE — ED Notes (Signed)
Pt transported to CT ?

## 2020-02-27 ENCOUNTER — Encounter (HOSPITAL_COMMUNITY): Payer: Self-pay | Admitting: Otolaryngology

## 2020-02-27 LAB — COMPREHENSIVE METABOLIC PANEL
ALT: 10 U/L (ref 0–44)
AST: 12 U/L — ABNORMAL LOW (ref 15–41)
Albumin: 3.1 g/dL — ABNORMAL LOW (ref 3.5–5.0)
Alkaline Phosphatase: 63 U/L (ref 38–126)
Anion gap: 9 (ref 5–15)
BUN: 10 mg/dL (ref 6–20)
CO2: 25 mmol/L (ref 22–32)
Calcium: 8.9 mg/dL (ref 8.9–10.3)
Chloride: 102 mmol/L (ref 98–111)
Creatinine, Ser: 0.71 mg/dL (ref 0.61–1.24)
GFR calc Af Amer: >60 mL/min (ref >60)
GFR calc non Af Amer: >60 mL/min (ref >60)
Glucose, Bld: 105 mg/dL — ABNORMAL HIGH (ref 70–99)
Potassium: 3 mmol/L — ABNORMAL LOW (ref 3.5–5.1)
Sodium: 136 mmol/L (ref 135–145)
Total Bilirubin: 0.4 mg/dL (ref 0.3–1.2)
Total Protein: 6.7 g/dL (ref 6.5–8.1)

## 2020-02-27 LAB — CBC
HCT: 35.9 % — ABNORMAL LOW (ref 39.0–52.0)
Hemoglobin: 12 g/dL — ABNORMAL LOW (ref 13.0–17.0)
MCH: 30.2 pg (ref 26.0–34.0)
MCHC: 33.4 g/dL (ref 30.0–36.0)
MCV: 90.2 fL (ref 80.0–100.0)
Platelets: 392 10*3/uL (ref 150–400)
RBC: 3.98 MIL/uL — ABNORMAL LOW (ref 4.22–5.81)
RDW: 12.5 % (ref 11.5–15.5)
WBC: 16.2 10*3/uL — ABNORMAL HIGH (ref 4.0–10.5)
nRBC: 0 % (ref 0.0–0.2)

## 2020-02-27 LAB — HIV ANTIBODY (ROUTINE TESTING W REFLEX): HIV Screen 4th Generation wRfx: NONREACTIVE

## 2020-02-27 MED ORDER — SODIUM CHLORIDE 0.9% FLUSH: 10.0000 mL | INTRAVENOUS | Status: DC | PRN

## 2020-02-27 MED ORDER — SODIUM CHLORIDE 0.9% FLUSH
10.0000 mL | Freq: Two times a day (BID) | INTRAVENOUS | Status: DC
  Administered 2020-02-27: 10 mL

## 2020-02-27 MED ORDER — MENTHOL 3 MG MT LOZG
1.0000 | LOZENGE | OROMUCOSAL | Status: DC | PRN
  Administered 2020-02-27: 3 mg via ORAL
  Filled 2020-02-27: qty 9

## 2020-02-27 MED ORDER — POTASSIUM CHLORIDE CRYS ER 20 MEQ PO TBCR
40.0000 meq | EXTENDED_RELEASE_TABLET | Freq: Once | ORAL | Status: AC
  Administered 2020-02-27: 40 meq via ORAL
  Filled 2020-02-27: qty 2

## 2020-02-27 MED ORDER — POLYETHYLENE GLYCOL 3350 17 G PO PACK: 17.0000 g | PACK | Freq: Every day | ORAL | Status: DC | PRN

## 2020-02-27 MED ORDER — SENNOSIDES-DOCUSATE SODIUM 8.6-50 MG PO TABS: 1.0000 | ORAL_TABLET | Freq: Two times a day (BID) | ORAL | Status: DC

## 2020-02-27 NOTE — Progress Notes (Signed)
   Subjective:    Patient ID: Noah Turner, male    DOB: 29-Jul-1985, 34 y.o.   MRN: [id number]  HPI His neck is feeling better.  There has been a lot of drainage.  Review of Systems     Objective:   Physical Exam AF VSS Alert, NAD Left neck Penrose drain in place with purulent drainage, dressing in place    Assessment & Plan:  Left neck abscess s/p I&D  He needs a few days of IV antibiotics with the drain present before he is medically stable for discharge.  He is evidently and unsurprisingly planning to leave the hospital against medical advice.

## 2020-02-27 NOTE — Progress Notes (Signed)
Patient notified this RN that he wanted to leave AMA due to having court tomorrow. Made patient aware of the dangers of leaving AMA and patient verbalized understanding. Dr. Jomarie Longs was made aware and talked to patient at bedside. Dr. Jenne Pane was also made aware at bedside.   Midline catheter discontinued intact. Site without signs and symptoms of complications. Dressing and pressure applied. Pt denies pain at the site currently. No complaints noted.   Myrtis Hopping, RN

## 2020-02-27 NOTE — Progress Notes (Signed)
PROGRESS NOTE    Noah Turner  ZOX:[id number] DOB: May 26, 1986 DOA: 02/26/2020 PCP: Patient, No Pcp Per  Brief Narrative:Noah Turner is a 33/m w/ medical history of IVDA,  anxiety presented with left-sided neck pain that started approximately 3 days prior to this admission.  He noticed a boil on the left side of his neck that had gradually gotten bigger and more painful with increased edema and swelling.  Denies using IV drugs for the last 4 to 5 years, does get some oral opiates off the street. Recently admitted to the hospital [facility] from 12/05/2019 to 12/07/2019 for a right-sided neck abscess.  At that time, ENT performed a debridement.  The patient left AGAINST MEDICAL ADVICE on the second postoperative day.  -In the ED he had a temp of 99.8 labs noted WBC of 18, CT neck showed a phlegmonous change in the left side of the neck measuring 3.1 x 6.1 x 6.2 cm associated with the sternocleidomastoid.  There is also rightward displacement of the patient's trachea with posterior displacement of the left thyroid lobe.  There was effacement of the left internal jugular vein.  An additional phlegmon was also noted on the patient's chin.  ENT, Dr. Jenne PaneBates was consulted, underwent incision and drainage 8/31 evening, drain was placed   Assessment & Plan:   Large left neck abscess -Prior history of IV drug abuse, denies active use currently -Reports this started as a boil in his chin which evolved to current abscess -ENT Dr. Jenne PaneBates consulted, underwent incision and drainage 8/31 -Follow-up or cultures   Polysubstance abuse Tobacco abuse -Denies active IV drug use, buys oral opiates off the street -Counseled  DVT prophylaxis: Lovenox Code Status: Full code Family Communication: No family at bedside Disposition Plan:  Status is: Inpatient  Remains inpatient appropriate because:Inpatient level of care appropriate due to severity of illness   Dispo: The patient is from: Home              Anticipated  d/c is to: Home              Anticipated d/c date is: 2 days              Patient currently is not medically stable to d/c.  Consultants:   ENT   Procedures: Incision and drainage of left neck abscess 8/31 Dr. Jenne PaneBates  Antimicrobials:    Subjective: -Still has pain in his neck but overall feeling better  Objective: Vitals:   02/26/20 2056 02/27/20 0056 02/27/20 0450 02/27/20 0856  BP: 126/77 121/79 124/85 122/87  Pulse: 90 86 73 77  Resp: 18 17 18 18   Temp: 98.1 F (36.7 C) 98 F (36.7 C) 98.2 F (36.8 C)   TempSrc:  Oral    SpO2: 99% 100% 98% 100%  Weight:      Height:        Intake/Output Summary (Last 24 hours) at 02/27/2020 1134 Last data filed at 02/27/2020 0900 Gross per 24 hour  Intake 1685.89 ml  Output 0 ml  Net 1685.89 ml   Filed Weights   02/26/20 0804  Weight: 86.2 kg    Examination:  General exam: Sitting up in bed, AAOx3, no distress HEENT, left neck with surgical dressing, boil on his chin  CVS: S1-S2, regular rate rhythm Lungs: Clear bilaterally Abdomen: Soft, nontender, bowel sounds present Extremities: No edema  Skin: No rashes on exposed skin Psychiatry: Flat affect.     Data Reviewed:   CBC: Recent Labs  Lab  02/26/20 0919 02/27/20 0242  WBC 18.2* 16.2*  NEUTROABS 14.2*  --   HGB 13.3 12.0*  HCT 40.0 35.9*  MCV 91.7 90.2  PLT 424* 392   Basic Metabolic Panel: Recent Labs  Lab 02/26/20 0919 02/27/20 0242  NA 137 136  K 3.3* 3.0*  CL 101 102  CO2 27 25  GLUCOSE 103* 105*  BUN 12 10  CREATININE 0.79 0.71  CALCIUM 9.0 8.9   GFR: Estimated Creatinine Clearance: 144.2 mL/min (by C-G formula based on SCr of 0.71 mg/dL). Liver Function Tests: Recent Labs  Lab 02/27/20 0242  AST 12*  ALT 10  ALKPHOS 63  BILITOT 0.4  PROT 6.7  ALBUMIN 3.1*   No results for input(s): LIPASE, AMYLASE in the last 168 hours. No results for input(s): AMMONIA in the last 168 hours. Coagulation Profile: No results for input(s): INR,  PROTIME in the last 168 hours. Cardiac Enzymes: No results for input(s): CKTOTAL, CKMB, CKMBINDEX, TROPONINI in the last 168 hours. BNP (last 3 results) No results for input(s): PROBNP in the last 8760 hours. HbA1C: No results for input(s): HGBA1C in the last 72 hours. CBG: No results for input(s): GLUCAP in the last 168 hours. Lipid Profile: No results for input(s): CHOL, HDL, LDLCALC, TRIG, CHOLHDL, LDLDIRECT in the last 72 hours. Thyroid Function Tests: No results for input(s): TSH, T4TOTAL, FREET4, T3FREE, THYROIDAB in the last 72 hours. Anemia Panel: No results for input(s): VITAMINB12, FOLATE, FERRITIN, TIBC, IRON, RETICCTPCT in the last 72 hours. Urine analysis:    Component Value Date/Time   COLORURINE YELLOW 10/23/2008 1503   APPEARANCEUR CLEAR 10/23/2008 1503   LABSPEC 1.016 10/23/2008 1503   PHURINE 6.5 10/23/2008 1503   GLUCOSEU NEGATIVE 10/23/2008 1503   HGBUR NEGATIVE 10/23/2008 1503   BILIRUBINUR NEGATIVE 10/23/2008 1503   KETONESUR NEGATIVE 10/23/2008 1503   PROTEINUR NEGATIVE 10/23/2008 1503   UROBILINOGEN 0.2 10/23/2008 1503   NITRITE NEGATIVE 10/23/2008 1503   LEUKOCYTESUR  10/23/2008 1503    NEGATIVE MICROSCOPIC NOT DONE ON URINES WITH NEGATIVE PROTEIN, BLOOD, LEUKOCYTES, NITRITE, OR GLUCOSE <1000 mg/dL.   Sepsis Labs: @LABRCNTIP (procalcitonin:4,lacticidven:4)  ) Recent Results (from the past 240 hour(s))  SARS Coronavirus 2 by RT PCR (hospital order, performed in [facility] hospital lab) Nasopharyngeal Nasopharyngeal Swab     Status: None   Collection Time: 02/26/20  1:36 PM   Specimen: Nasopharyngeal Swab  Result Value Ref Range Status   SARS Coronavirus 2 NEGATIVE NEGATIVE Final    Comment: (NOTE) SARS-CoV-2 target nucleic acids are NOT DETECTED.  The SARS-CoV-2 RNA is generally detectable in upper and lower respiratory specimens during the acute phase of infection. The lowest concentration of SARS-CoV-2 viral copies this assay can detect is  250 copies / mL. A negative result does not preclude SARS-CoV-2 infection and should not be used as the sole basis for treatment or other patient management decisions.  A negative result may occur with improper specimen collection / handling, submission of specimen other than nasopharyngeal swab, presence of viral mutation(s) within the areas targeted by this assay, and inadequate number of viral copies (<250 copies / mL). A negative result must be combined with clinical observations, patient history, and epidemiological information.  Fact Sheet for Patients:   02/28/20  Fact Sheet for Healthcare Providers: BoilerBrush.com.cy  This test is not yet approved or  cleared by the https://pope.com/ FDA and has been authorized for detection and/or diagnosis of SARS-CoV-2 by FDA under an Emergency Use Authorization (EUA).  This EUA will  remain in effect (meaning this test can be used) for the duration of the COVID-19 declaration under Section 564(b)(1) of the Act, 21 U.S.C. section 360bbb-3(b)(1), unless the authorization is terminated or revoked sooner.  Performed at [facility], [address] [place], [place] [address]   Surgical pcr screen     Status: None   Collection Time: 02/26/20  5:04 PM   Specimen: Nasal Mucosa; Nasal Swab  Result Value Ref Range Status   MRSA, PCR NEGATIVE NEGATIVE Final   Staphylococcus aureus NEGATIVE NEGATIVE Final    Comment: (NOTE) The Xpert SA Assay (FDA approved for NASAL specimens in patients 45 years of age and older), is one component of a comprehensive surveillance program. It is not intended to diagnose infection nor to guide or monitor treatment. Performed at [facility], [address] [place], [place] [address]   Aerobic/Anaerobic Culture (surgical/deep wound)     Status: None (Preliminary result)   Collection Time: 02/26/20  6:35 PM   Specimen: PATH Other; ENT  Result Value Ref  Range Status   Specimen Description ABSCESS LEFT NECK  Final   Special Requests PATIENT ON FOLLOWING CEFIPIME  Final   Gram Stain   Final    ABUNDANT WBC PRESENT, PREDOMINANTLY PMN ABUNDANT GRAM POSITIVE COCCI    Culture   Final    CULTURE REINCUBATED FOR BETTER GROWTH Performed at [facility], [address], [place], [place] [address]    Report Status PENDING  Incomplete         Radiology Studies: CT Soft Tissue Neck W Contrast  Result Date: 02/26/2020 CLINICAL DATA:  Neck mass, nonpulsatile. Additional history provided: Left-sided neck mass, patient also reports being bitten by something on the chin, pus draining from that wound. EXAM: CT NECK WITH CONTRAST TECHNIQUE: Multidetector CT imaging of the neck was performed using the standard protocol following the bolus administration of intravenous contrast. CONTRAST:  2mL OMNIPAQUE IOHEXOL 300 MG/ML  SOLN COMPARISON:  Chest CT 10/08/2016, CT of the cervical spine 09/21/2007. FINDINGS: Pharynx and larynx: No appreciable swelling or discrete mass within the oral cavity, pharynx or larynx. Salivary glands: No inflammation, mass, or stone. Thyroid: The thyroid gland is intrinsically unremarkable. However, extensive phlegmon/abscess posteriorly displaces the left thyroid lobe. Lymph nodes: Mild bilateral cervical lymphadenopathy, greater on the left and likely reactive. Vascular: The major vascular structures of the neck are patent. Inflammatory changes within the left lower neck significantly narrow the caudal left internal jugular vein. Limited intracranial: No acute intracranial abnormality is identified. Visualized orbits: Incompletely imaged. Visualized orbits show no acute finding. Mastoids and visualized paranasal sinuses: Mild ethmoid sinus mucosal thickening. Moderate-sized left maxillary sinus mucous retention cyst. No significant mastoid effusion. Skeleton: No acute bony abnormality or aggressive osseous lesion. Upper chest: No  consolidation within the imaged lung apices. Other: There are cellulitis changes/phlegmon within the chin soft tissues. Centrally within this region, there is a hypodense and peripherally enhancing focus consistent with abscess measuring 1.6 cm (series 2, image 58). There are prominent cellulitis and phlegmonous changes within the left mid to lower neck. This includes a large region of phlegmon within the left lower neck measuring 3.7 x 6.1 x 6.2 cm (for instance as seen on series 2, image 85) (series 5, image 68). This extensive phlegmonous change is intimately associated with the left sternocleidomastoid muscle. Centrally within the region of phlegmonous change, there is multiloculated abscess formation which appears to extend to the skin surface as a sinus tract. The  largest abscess component measures 2.7 cm (series 4, image 52). Associated rightward deviation of the subglottic trachea without airway narrowing. These results were called by telephone at the time of interpretation on 02/26/2020 at 12:34 pm to provider Geoffery Lyons , who verbally acknowledged these results. IMPRESSION: Prominent cellulitis and phlegmonous changes within the left mid to lower neck. This includes a large region of phlegmon within the left lower neck measuring 3.7 x 6.1 x 6.2 cm, intimately associated with the left sternocleidomastoid muscle. Centrally within this region of phlegmon, there is multiloculated abscess formation which appears to extend to the skin surface as a sinus tract. The largest abscess component measures 2.7 cm. There is resultant rightward displacement of the subglottic trachea without airway narrowing. There is also posterior displacement of the left thyroid lobe and significant effacement of the caudal left internal jugular vein. Additional cellulitis/phlegmonous changes with 1.6 cm abscess within the chin soft tissues as described. Bilateral cervical lymphadenopathy, greater on the left and likely reactive.  Paranasal sinus disease as described. Electronically Signed   By: Jackey Loge DO   On: 02/26/2020 12:35        Scheduled Meds: . enoxaparin (LOVENOX) injection  40 mg Subcutaneous Q24H  . nicotine  21 mg Transdermal Daily  . sodium chloride flush  10-40 mL Intracatheter Q12H   Continuous Infusions: . 0.9 % NaCl with KCl 20 mEq / L 75 mL/hr at 02/26/20 2217  . ceFEPime (MAXIPIME) IV 2 g (02/27/20 0539)  . lactated ringers 10 mL/hr at 02/26/20 1755  . vancomycin 1,500 mg (02/27/20 1021)     LOS: 1 day    Time spent:  Zannie Cove, MD [facility] 02/27/2020, 11:34 AM

## 2020-02-28 NOTE — Anesthesia Postprocedure Evaluation (Signed)
Anesthesia Post Note  Patient: Noah Turner  Procedure(s) Performed: INCISION AND DRAINAGE LEFT NECK ABSCESS (Left Neck)     Patient location during evaluation: PACU Anesthesia Type: General Level of consciousness: awake and alert Pain management: pain level controlled Vital Signs Assessment: post-procedure vital signs reviewed and stable Respiratory status: spontaneous breathing, nonlabored ventilation, respiratory function stable and patient connected to nasal cannula oxygen Cardiovascular status: blood pressure returned to baseline and stable Postop Assessment: no apparent nausea or vomiting Anesthetic complications: no   No complications documented.  Last Vitals:  Vitals:   02/27/20 0450 02/27/20 0856  BP: 124/85 122/87  Pulse: 73 77  Resp: 18 18  Temp: 36.8 C   SpO2: 98% 100%    Last Pain:  Vitals:   02/27/20 1206  TempSrc:   PainSc: 8                  Shyanna Klingel S

## 2020-02-28 NOTE — Discharge Summary (Signed)
Physician Discharge Summary  Annette Liotta JEH:[id number] DOB: 1986/01/28 DOA: 02/26/2020  PCP: Patient, No Pcp Per  Admit date: 02/26/2020 Discharge date: 02/27/2020  Time spent: 35 minutes  Recommendations for Outpatient Follow-up:  Left AGAINST MEDICAL ADVICE  Discharge Diagnoses:  Active Problems:   Neck abscess   Tobacco abuse   Opioid abuse (HCC)   Filed Weights   02/26/20 0804  Weight: 86.2 kg    History of present illness:  Noah Turner a 33/m w/ medical history of IVDA,  anxiety presented with left-sided neck pain that started approximately 3 days prior to this admission. He noticed a boil on the left side of his neck that had gradually gotten bigger and more painful with increased edema and swelling.  Denies using IV drugs for the last 4 to 5 years, does get some oral opiates off the street. [facility] for aright-sided neck abscess. At that time, ENT performed a debridement. The patient left AGAINST MEDICAL ADVICE on the second postoperative day.  -In the ED he had a temp of 99.8 labs noted WBC of 18, CT neck showed a phlegmonous change in the left side of the neck measuring 3.1 x 6.1 x 6.2 cm associated with the sternocleidomastoid. There is also rightward displacement of the patient's trachea with posterior displacement of the left thyroid lobe. There was effacement of the left internal jugular vein. An additional phlegmon was also noted on the patient's chin. ENT, Dr. Jenne Pane was consulted, underwent incision and drainage 8/31 evening, drain was placed  Hospital Course:   Large left neck abscess -Prior history of IV drug abuse, denies active use currently -Reports this started as a boil in his chin which evolved to current abscess -ENT Dr. Jenne Pane consulted, underwent incision and drainage 8/31 -Suddenly this afternoon was adamant to leave reporting that he has a court date tomorrow morning, I advised him  that I would be happy to call his parole officer or give him our fax a note for court for tomorrow, was not agreeable to this, -Signed AMA papers and left AGAINST MEDICAL ADVICE  Polysubstance abuse Tobacco abuse -Denies active IV drug use, buys oral opiates off the street -Counseled  Consultants:   ENT   Procedures: Incision and drainage of left neck abscess 8/31 Dr. Jenne Pane   Discharge Exam: Vitals:   02/27/20 0450 02/27/20 0856  BP: 124/85 122/87  Pulse: 73 77  Resp: 18 18  Temp: 98.2 F (36.8 C)   SpO2: 98% 100%    General: AAOx3 Cardiovascular: S1S2/RRR Respiratory: CTAB  Discharge Instructions    Allergies as of 02/27/2020      Reactions   Hydrocodone Hives, Nausea And Vomiting      Medication List    You have not been prescribed any medications.    Allergies  Allergen Reactions  . Hydrocodone Hives and Nausea And Vomiting      The results of significant diagnostics from this hospitalization (including imaging, microbiology, ancillary and laboratory) are listed below for reference.    Significant Diagnostic Studies: CT Soft Tissue Neck W Contrast  Result Date: 02/26/2020 CLINICAL DATA:  Neck mass, nonpulsatile. Additional history provided: Left-sided neck mass, patient also reports being bitten by something on the chin, pus draining from that wound. EXAM: CT NECK WITH CONTRAST TECHNIQUE: Multidetector CT imaging of the neck was performed using the standard protocol following the bolus administration of intravenous contrast. CONTRAST:  36mL OMNIPAQUE IOHEXOL 300 MG/ML  SOLN COMPARISON:  Chest  CT 10/08/2016, CT of the cervical spine 09/21/2007. FINDINGS: Pharynx and larynx: No appreciable swelling or discrete mass within the oral cavity, pharynx or larynx. Salivary glands: No inflammation, mass, or stone. Thyroid: The thyroid gland is intrinsically unremarkable. However, extensive phlegmon/abscess posteriorly displaces the left thyroid lobe. Lymph nodes:  Mild bilateral cervical lymphadenopathy, greater on the left and likely reactive. Vascular: The major vascular structures of the neck are patent. Inflammatory changes within the left lower neck significantly narrow the caudal left internal jugular vein. Limited intracranial: No acute intracranial abnormality is identified. Visualized orbits: Incompletely imaged. Visualized orbits show no acute finding. Mastoids and visualized paranasal sinuses: Mild ethmoid sinus mucosal thickening. Moderate-sized left maxillary sinus mucous retention cyst. No significant mastoid effusion. Skeleton: No acute bony abnormality or aggressive osseous lesion. Upper chest: No consolidation within the imaged lung apices. Other: There are cellulitis changes/phlegmon within the chin soft tissues. Centrally within this region, there is a hypodense and peripherally enhancing focus consistent with abscess measuring 1.6 cm (series 2, image 58). There are prominent cellulitis and phlegmonous changes within the left mid to lower neck. This includes a large region of phlegmon within the left lower neck measuring 3.7 x 6.1 x 6.2 cm (for instance as seen on series 2, image 85) (series 5, image 68). This extensive phlegmonous change is intimately associated with the left sternocleidomastoid muscle. Centrally within the region of phlegmonous change, there is multiloculated abscess formation which appears to extend to the skin surface as a sinus tract. The largest abscess component measures 2.7 cm (series 4, image 52). Associated rightward deviation of the subglottic trachea without airway narrowing. These results were called by telephone at the time of interpretation on 02/26/2020 at 12:34 pm to provider Geoffery Lyons , who verbally acknowledged these results. IMPRESSION: Prominent cellulitis and phlegmonous changes within the left mid to lower neck. This includes a large region of phlegmon within the left lower neck measuring 3.7 x 6.1 x 6.2 cm,  intimately associated with the left sternocleidomastoid muscle. Centrally within this region of phlegmon, there is multiloculated abscess formation which appears to extend to the skin surface as a sinus tract. The largest abscess component measures 2.7 cm. There is resultant rightward displacement of the subglottic trachea without airway narrowing. There is also posterior displacement of the left thyroid lobe and significant effacement of the caudal left internal jugular vein. Additional cellulitis/phlegmonous changes with 1.6 cm abscess within the chin soft tissues as described. Bilateral cervical lymphadenopathy, greater on the left and likely reactive. Paranasal sinus disease as described. Electronically Signed   By: Jackey Loge DO   On: 02/26/2020 12:35    Microbiology: Recent Results (from the past 240 hour(s))  SARS Coronavirus 2 by RT PCR (hospital order, performed in [facility] hospital lab) Nasopharyngeal Nasopharyngeal Swab     Status: None   Collection Time: 02/26/20  1:36 PM   Specimen: Nasopharyngeal Swab  Result Value Ref Range Status   SARS Coronavirus 2 NEGATIVE NEGATIVE Final    Comment: (NOTE) SARS-CoV-2 target nucleic acids are NOT DETECTED.  The SARS-CoV-2 RNA is generally detectable in upper and lower respiratory specimens during the acute phase of infection. The lowest concentration of SARS-CoV-2 viral copies this assay can detect is 250 copies / mL. A negative result does not preclude SARS-CoV-2 infection and should not be used as the sole basis for treatment or other patient management decisions.  A negative result may occur with improper specimen collection / handling, submission of specimen other than  nasopharyngeal swab, presence of viral mutation(s) within the areas targeted by this assay, and inadequate number of viral copies (<250 copies / mL). A negative result must be combined with clinical observations, patient history, and epidemiological  information.  Fact Sheet for Patients:   BoilerBrush.com.cy  Fact Sheet for Healthcare Providers: https://pope.com/  This test is not yet approved or  cleared by the [place] FDA and has been authorized for detection and/or diagnosis of SARS-CoV-2 by FDA under an Emergency Use Authorization (EUA).  This EUA will remain in effect (meaning this test can be used) for the duration of the COVID-19 declaration under Section 564(b)(1) of the Act, 21 U.S.C. section 360bbb-3(b)(1), unless the authorization is terminated or revoked sooner.  Performed at [facility], [address], [place], [place] [address]   Surgical pcr screen     Status: None   Collection Time: 02/26/20  5:04 PM   Specimen: Nasal Mucosa; Nasal Swab  Result Value Ref Range Status   MRSA, PCR NEGATIVE NEGATIVE Final   Staphylococcus aureus NEGATIVE NEGATIVE Final    Comment: (NOTE) The Xpert SA Assay (FDA approved for NASAL specimens in patients 50 years of age and older), is one component of a comprehensive surveillance program. It is not intended to diagnose infection nor to guide or monitor treatment. Performed at [facility], [address] [place], [place] [address]   Aerobic/Anaerobic Culture (surgical/deep wound)     Status: None (Preliminary result)   Collection Time: 02/26/20  6:35 PM   Specimen: PATH Other; ENT  Result Value Ref Range Status   Specimen Description ABSCESS LEFT NECK  Final   Special Requests PATIENT ON FOLLOWING CEFIPIME  Final   Gram Stain   Final    ABUNDANT WBC PRESENT, PREDOMINANTLY PMN ABUNDANT GRAM POSITIVE COCCI Performed at [facility], [address], [place], [place] [address]    Culture   Final    RARE STAPHYLOCOCCUS AUREUS SUSCEPTIBILITIES TO FOLLOW NO ANAEROBES ISOLATED; CULTURE IN PROGRESS FOR 5 DAYS    Report Status PENDING  Incomplete     Labs: Basic Metabolic Panel: Recent Labs  Lab  02/26/20 0919 02/27/20 0242  NA 137 136  K 3.3* 3.0*  CL 101 102  CO2 27 25  GLUCOSE 103* 105*  BUN 12 10  CREATININE 0.79 0.71  CALCIUM 9.0 8.9   Liver Function Tests: Recent Labs  Lab 02/27/20 0242  AST 12*  ALT 10  ALKPHOS 63  BILITOT 0.4  PROT 6.7  ALBUMIN 3.1*   No results for input(s): LIPASE, AMYLASE in the last 168 hours. No results for input(s): AMMONIA in the last 168 hours. CBC: Recent Labs  Lab 02/26/20 0919 02/27/20 0242  WBC 18.2* 16.2*  NEUTROABS 14.2*  --   HGB 13.3 12.0*  HCT 40.0 35.9*  MCV 91.7 90.2  PLT 424* 392   Cardiac Enzymes: No results for input(s): CKTOTAL, CKMB, CKMBINDEX, TROPONINI in the last 168 hours. BNP: BNP (last 3 results) No results for input(s): BNP in the last 8760 hours.  ProBNP (last 3 results) No results for input(s): PROBNP in the last 8760 hours.  CBG: No results for input(s): GLUCAP in the last 168 hours.   Signed:  Zannie Cove MD.  [facility] 02/28/2020, 3:12 PM

## 2020-03-02 LAB — AEROBIC/ANAEROBIC CULTURE W GRAM STAIN (SURGICAL/DEEP WOUND)
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
## 2023-01-12 ENCOUNTER — Encounter (HOSPITAL_COMMUNITY): Payer: Self-pay

## 2023-01-12 ENCOUNTER — Other Ambulatory Visit: Payer: Self-pay

## 2023-01-12 ENCOUNTER — Emergency Department (HOSPITAL_COMMUNITY)
Admission: EM | Admit: 2023-01-12 | Discharge: 2023-01-12 | Disposition: A | Discharge status: Home / Self Care | Payer: Medicaid Other | Attending: Emergency Medicine | Admitting: Emergency Medicine

## 2023-01-12 DIAGNOSIS — R112 Nausea with vomiting, unspecified: Secondary | ICD-10-CM | POA: Diagnosis not present

## 2023-01-12 DIAGNOSIS — R591 Generalized enlarged lymph nodes: Secondary | ICD-10-CM | POA: Diagnosis not present

## 2023-01-12 LAB — COMPREHENSIVE METABOLIC PANEL
ALT: 12 U/L (ref 0–44)
AST: 19 U/L (ref 15–41)
Albumin: 4.4 g/dL (ref 3.5–5.0)
Alkaline Phosphatase: 65 U/L (ref 38–126)
Anion gap: 7 (ref 5–15)
BUN: 14 mg/dL (ref 6–20)
CO2: 27 mmol/L (ref 22–32)
Calcium: 9.3 mg/dL (ref 8.9–10.3)
Chloride: 105 mmol/L (ref 98–111)
Creatinine, Ser: 0.85 mg/dL (ref 0.61–1.24)
GFR, Estimated: >60 mL/min (ref >60)
Glucose, Bld: 95 mg/dL (ref 70–99)
Potassium: 3.6 mmol/L (ref 3.5–5.1)
Sodium: 139 mmol/L (ref 135–145)
Total Bilirubin: 0.6 mg/dL (ref 0.3–1.2)
Total Protein: 7.6 g/dL (ref 6.5–8.1)

## 2023-01-12 LAB — CBC WITH DIFFERENTIAL/PLATELET
Abs Immature Granulocytes: 0.01 10*3/uL (ref 0.00–0.07)
Basophils Absolute: 0 10*3/uL (ref 0.0–0.1)
Basophils Relative: 1 %
Eosinophils Absolute: 0 10*3/uL (ref 0.0–0.5)
Eosinophils Relative: 1 %
HCT: 44.2 % (ref 39.0–52.0)
Hemoglobin: 14.7 g/dL (ref 13.0–17.0)
Immature Granulocytes: 0 %
Lymphocytes Relative: 34 %
Lymphs Abs: 1.4 10*3/uL (ref 0.7–4.0)
MCH: 30 pg (ref 26.0–34.0)
MCHC: 33.3 g/dL (ref 30.0–36.0)
MCV: 90.2 fL (ref 80.0–100.0)
Monocytes Absolute: 0.2 10*3/uL (ref 0.1–1.0)
Monocytes Relative: 6 %
Neutro Abs: 2.3 10*3/uL (ref 1.7–7.7)
Neutrophils Relative %: 58 %
Platelets: 210 10*3/uL (ref 150–400)
RBC: 4.9 MIL/uL (ref 4.22–5.81)
RDW: 12.7 % (ref 11.5–15.5)
WBC: 3.9 10*3/uL — ABNORMAL LOW (ref 4.0–10.5)
nRBC: 0 % (ref 0.0–0.2)

## 2023-01-12 MED ORDER — DOXYCYCLINE HYCLATE 100 MG PO CAPS: 100.0000 mg | ORAL_CAPSULE | Freq: Two times a day (BID) | ORAL | 0 refills | Status: AC

## 2023-01-12 NOTE — ED Provider Notes (Signed)
[place] EMERGENCY DEPARTMENT AT [facility] Provider Note   CSN: [id number] Arrival date & time: 01/12/23  1236     History  Chief Complaint  Patient presents with   Lymphadenopathy   Emesis    Noah Turner is a 37 y.o. male history of IVDU, discitis, paraspinal abscess, opioid use disorder, osteomyelitis presented after noticing a tick on his right inner thigh on 7 4.  Patient states on 7 4 he noticed the tick and removed it promptly but was unsure if it was engorged or not.  Patient dates on 7 8 he began experiencing viral-like symptoms in which she endorses chills weakness.  Patient does not endorse any rashes on his leg, palms, soles of his feet but does note that his right inguinal lymph nodes appeared swollen to the size of a "tennis ball" and were tender to palpation.  Patient states he tried to have red meat once and then threw up but is been able to eat other kinds of meat.  Patient denied abdominal pain, fevers, shortness of breath, back pain, change in sensation/motor skills, recent travel  Home Medications Prior to Admission medications   Medication Sig Start Date End Date Taking? Authorizing Provider  doxycycline (VIBRAMYCIN) 100 MG capsule Take 1 capsule (100 mg total) by mouth 2 (two) times daily. 01/12/23  Yes Netta Corrigan, PA-C      Allergies    Hydrocodone    Review of Systems   Review of Systems  Gastrointestinal:  Positive for vomiting.    Physical Exam Updated Vital Signs BP 118/88   Pulse (!) 58   Temp 97.8 F (36.6 C)   Resp 18   Ht 6' (1.829 m)   Wt 86 kg   SpO2 100%   BMI 25.71 kg/m  Physical Exam Exam conducted with a chaperone present.  Constitutional:      General: He is not in acute distress. Cardiovascular:     Rate and Rhythm: Normal rate and regular rhythm.     Pulses: Normal pulses.  Pulmonary:     Effort: Pulmonary effort is normal.  Abdominal:     Palpations: Abdomen is soft.     Tenderness: There is no  abdominal tenderness. There is no guarding or rebound.  Genitourinary:    Penis: Circumcised.      Testes: Normal. Cremasteric reflex is present.       Comments: Chaperone:Lewis, Harlen Labs, RN Right inguinal lymphadenopathy noted No overlying skin color changes noted No penile lesions noted Skin:    Capillary Refill: Capillary refill takes less than 2 seconds.     Comments: No rashes noted on palms/soles of feet No bull's-eye rash noted No other rashes noted or other skin color changes No track marks noted  Neurological:     Mental Status: He is alert.     Comments: Sensation intact in all 4 limbs     ED Results / Procedures / Treatments   Labs (all labs ordered are listed, but only abnormal results are displayed) Labs Reviewed  CBC WITH DIFFERENTIAL/PLATELET - Abnormal; Notable for the following components:      Result Value   WBC 3.9 (*)    All other components within normal limits  COMPREHENSIVE METABOLIC PANEL  LYME DISEASE SEROLOGY W/REFLEX  ALPHA GALACTOSIDASE    EKG None  Radiology No results found.  Procedures Procedures    Medications Ordered in ED Medications - No data to display  ED Course/ Medical Decision Making/ A&P  Medical Decision Making Amount and/or Complexity of Data Reviewed Labs: ordered.  Risk Prescription drug management.   Noah Turner 37 y.o. presented today for tick bite. Working DDx that I considered at this time includes, but not limited to, Lyme, ehrlichiosis, babesiosis, [place] spotted fever.  R/o DDx:  ehrlichiosis, babesiosis, [place] spotted fever: These are considered less likely due to history of present illness and physical exam findings  Review of prior external notes: 01/14/2021 admission  Unique Tests and My Interpretation:  CBC: Unremarkable CMP: Unremarkable Lyme: Pending Alpha Gal: pending  Discussion with Independent Historian: None  Discussion of Management of  Tests: None  Risk: Medium: prescription drug management  Risk Stratification Score: none  Staffed with Zammit, MD  Plan: On exam patient was in no acute distress stable vitals.  With a chaperone in the room a GU exam was conducted and the only abnormality noted was right inguinal lymphadenopathy.  No rashes were noted on patient's body and patient was not endorsing any fevers and was afebrile here.  Patient states he tried to have red meat in the form of a cheeseburger and picked back up but other than that has not had any episodes of emesis.  This is suspicious of alpha gal syndrome and so that level be drawn along with Lyme and [facility] spotted fever.  Basic labs also be ordered as patient has history of IVDU with severe complications.  Patient does know he has not used IV drugs in the past 6 years.  Basic labs are reassuring.  Spoke to the patient patient is comfortable with being discharged and following of the MyChart app for the rest of his labs.  Patient be given doxycycline for any tickborne disease he may have contracted and encouraged follow-up with her primary care provider to be monitored.  Patient states he does not need Zofran at this time.  Patient was given return precautions. Patient stable for discharge at this time.  Patient verbalized understanding of plan.         Final Clinical Impression(s) / ED Diagnoses Final diagnoses:  Lymphadenopathy  Nausea and vomiting, unspecified vomiting type    Rx / DC Orders ED Discharge Orders          Ordered    doxycycline (VIBRAMYCIN) 100 MG capsule  2 times daily        01/12/23 1501              Remi Deter 01/12/23 1510    Bethann Berkshire, MD 01/12/23 [phone]
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
## 2023-01-12 NOTE — Discharge Instructions (Addendum)
Please follow-up on the MyChart app for the rest your results.  Please pick up the antibiotic I prescribed for you for your symptoms.  Please also follow-up with your primary care provider to be monitored as symptoms may change.  If symptoms do change or worsen please return to ER.

## 2023-01-13 LAB — LYME DISEASE SEROLOGY W/REFLEX: Lyme Total Antibody EIA: NEGATIVE
